# Patient Record
Sex: Male | Born: 1990 | Hispanic: Yes | Marital: Single | Smoking: Current some day smoker
Health system: Southern US, Community
[De-identification: ages and names within clinical notes are randomized; demographics above are authoritative.]

---
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
## 2013-02-07 ENCOUNTER — Emergency Department: Payer: Self-pay | Admitting: Emergency Medicine

## 2013-02-09 LAB — BETA STREP CULTURE(ARMC)

## 2015-10-30 ENCOUNTER — Emergency Department
Admission: EM | Admit: 2015-10-30 | Discharge: 2015-10-30 | Disposition: A | Discharge status: Home / Self Care | Payer: Commercial Managed Care - PPO | Attending: Emergency Medicine | Admitting: Emergency Medicine

## 2015-10-30 ENCOUNTER — Encounter: Payer: Self-pay | Admitting: Emergency Medicine

## 2015-10-30 DIAGNOSIS — S81812A Laceration without foreign body, left lower leg, initial encounter: Secondary | ICD-10-CM | POA: Diagnosis present

## 2015-10-30 DIAGNOSIS — F17200 Nicotine dependence, unspecified, uncomplicated: Secondary | ICD-10-CM | POA: Insufficient documentation

## 2015-10-30 DIAGNOSIS — Y9269 Other specified industrial and construction area as the place of occurrence of the external cause: Secondary | ICD-10-CM | POA: Diagnosis not present

## 2015-10-30 DIAGNOSIS — Y288XXA Contact with other sharp object, undetermined intent, initial encounter: Secondary | ICD-10-CM | POA: Diagnosis not present

## 2015-10-30 DIAGNOSIS — Y9389 Activity, other specified: Secondary | ICD-10-CM | POA: Insufficient documentation

## 2015-10-30 DIAGNOSIS — Y990 Civilian activity done for income or pay: Secondary | ICD-10-CM | POA: Insufficient documentation

## 2015-10-30 DIAGNOSIS — Z23 Encounter for immunization: Secondary | ICD-10-CM | POA: Diagnosis not present

## 2015-10-30 MED ORDER — LIDOCAINE-EPINEPHRINE (PF) 1 %-1:200000 IJ SOLN
10.0000 mL | Freq: Once | INTRAMUSCULAR | Status: DC
  Filled 2015-10-30: qty 30

## 2015-10-30 MED ORDER — TETANUS-DIPHTH-ACELL PERTUSSIS 5-2.5-18.5 LF-MCG/0.5 IM SUSP
0.5000 mL | Freq: Once | INTRAMUSCULAR | Status: AC
  Administered 2015-10-30: 0.5 mL via INTRAMUSCULAR
  Filled 2015-10-30: qty 0.5

## 2015-10-30 MED ORDER — BACITRACIN ZINC 500 UNIT/GM EX OINT
TOPICAL_OINTMENT | CUTANEOUS | Status: AC
  Filled 2015-10-30: qty 0.9

## 2015-10-30 NOTE — ED Notes (Signed)
Laceration to left shin from metal rebar. Bleeding controlled. Tetanus not utd. Pain 7/10

## 2015-10-30 NOTE — ED Triage Notes (Signed)
Approx 1 hour ago was walking and cut leg on piece of rebar sticking out of ground. Material and tape dressing done by patient left on, no bleed through.

## 2015-10-30 NOTE — ED Provider Notes (Signed)
[facility] Emergency Department Provider Note  ____________________________________________  Time seen: Approximately 6:32 PM  I have reviewed the triage vital signs and the nursing notes.   HISTORY  Chief Complaint Extremity Laceration    HPI Peter Rose PhiRodriguez Jr. is a 25 y.o. male resistance emergency department for laceration to the left shin. Patient states that he was at work when he ran into exposed [employer], [employer] rebar. Patient states that he sustained a laceration to the shin. He was able control bleeding with direct pressure. He is unsure of his last tetanus immunization. Pain is minimal at this time. No other complaints.   History reviewed. No pertinent past medical history.  There are no active problems to display for this patient.   History reviewed. No pertinent surgical history.  Prior to Admission medications   Not on File    Allergies Review of patient's allergies indicates no known allergies.  No family history on file.  Social History Social History  Substance Use Topics  . Smoking status: Current Some Day Smoker  . Smokeless tobacco: Not on file  . Alcohol use Not on file     Review of Systems  Constitutional: No fever/chills Cardiovascular: no chest pain. Respiratory: no cough. No SOB. Musculoskeletal: Negative for musculoskeletal pain. Skin: Positive for laceration to the left shin Neurological: Negative for headaches, focal weakness or numbness. 10-point ROS otherwise negative.  ____________________________________________   PHYSICAL EXAM:  VITAL SIGNS: ED Triage Vitals  Enc Vitals Group     BP 10/30/15 1622 119/88     Pulse Rate 10/30/15 1622 75     Resp 10/30/15 1622 20     Temp 10/30/15 1622 98.1 F (36.7 C)     Temp Source 10/30/15 1622 Oral     SpO2 10/30/15 1622 97 %     Weight 10/30/15 1623 165 lb (74.8 kg)     Height 10/30/15 1623 6\' 1"  (1.854 m)     Head Circumference --      Peak Flow --      Pain  Score 10/30/15 1623 6     Pain Loc --      Pain Edu? --      Excl. in GC? --      Constitutional: Alert and oriented. Well appearing and in no acute distress. Eyes: Conjunctivae are normal. PERRL. EOMI. Head: Atraumatic. Cardiovascular: Normal rate, regular rhythm. Normal S1 and S2.  Good peripheral circulation. Respiratory: Normal respiratory effort without tachypnea or retractions. Lungs CTAB. Good air entry to the bases with no decreased or absent breath sounds. Musculoskeletal: Full range of motion to all extremities. No gross deformities appreciated. Neurologic:  Normal speech and language. No gross focal neurologic deficits are appreciated.  Skin:  Skin is warm, dry and intact. No rash noted. 10 cm laceration is noted to the left shin. This is gaped open approximately 1 to 1-1/2 cm. No bleeding at this time. Entire laceration is visualized no foreign body. Examination of the joint above and below the laceration are unremarkable. Psychiatric: Mood and affect are normal. Speech and behavior are normal. Patient exhibits appropriate insight and judgement.   ____________________________________________   LABS (all labs ordered are listed, but only abnormal results are displayed)  Labs Reviewed - No data to display ____________________________________________  EKG   ____________________________________________  RADIOLOGY   No results found.  ____________________________________________    PROCEDURES  Procedure(s) performed:    Marland Kitchen.Marland Kitchen.Laceration Repair Date/Time: 10/30/2015 8:39 PM Performed by: Gala RomneyUTHRIELL, Glory Graefe D Authorized by: Gala RomneyUTHRIELL, Geneive Sandstrom  D   Consent:    Consent obtained:  Verbal   Consent given by:  Patient   Risks discussed:  Pain Anesthesia (see MAR for exact dosages):    Anesthesia method:  Local infiltration   Local anesthetic:  Lidocaine 1% WITH epi Laceration details:    Location:  Leg   Leg location:  L lower leg   Length (cm):  11 Repair  type:    Repair type:  Simple Pre-procedure details:    Preparation:  Patient was prepped and draped in usual sterile fashion Exploration:    Hemostasis achieved with:  Direct pressure   Wound exploration: wound explored through full range of motion and entire depth of wound probed and visualized     Contaminated: no   Treatment:    Area cleansed with:  Betadine   Amount of cleaning:  Standard   Irrigation solution:  Sterile saline   Irrigation method:  Syringe Skin repair:    Repair method:  Sutures   Suture size:  3-0   Suture material:  Nylon   Suture technique:  Horizontal mattress   Number of sutures:  8 Approximation:    Approximation:  Close Post-procedure details:    Dressing:  Non-adherent dressing   Patient tolerance of procedure:  Tolerated well, no immediate complications      Medications  lidocaine-EPINEPHrine (XYLOCAINE-EPINEPHrine) 1 %-1:200000 (PF) injection 10 mL (not administered)  bacitracin 500 UNIT/GM ointment (not administered)  Tdap (BOOSTRIX) injection 0.5 mL (0.5 mLs Intramuscular Given 10/30/15 1922)     ____________________________________________   INITIAL IMPRESSION / ASSESSMENT AND PLAN / ED COURSE  Pertinent labs & imaging results that were available during my care of the patient were reviewed by me and considered in my medical decision making (see chart for details).  Review of the [place] CSRS was performed in accordance of the [facility] prior to dispensing any controlled drugs.  Clinical Course    Patient's diagnosis is consistent with Laceration to the left lower leg. This is closed as described above. No complications. Patient is given wound care shortened. He will follow-up with primary care or urgent care in 7-10 days for suture removal.. Patient is given ED precautions to return to the ED for any worsening or new symptoms.     ____________________________________________  FINAL CLINICAL IMPRESSION(S) / ED DIAGNOSES  Final  diagnoses:  Leg laceration, left, initial encounter      NEW MEDICATIONS STARTED DURING THIS VISIT:  There are no discharge medications for this patient.       This chart was dictated using voice recognition software/Dragon. Despite best efforts to proofread, errors can occur which can change the meaning. Any change was purely unintentional.    Racheal Patches, PA-C 10/30/15 2040    Phineas Semen, MD 10/31/15 [phone]

## 2016-01-10 ENCOUNTER — Emergency Department
Admission: EM | Admit: 2016-01-10 | Discharge: 2016-01-10 | Disposition: A | Discharge status: Home / Self Care | Payer: Commercial Managed Care - PPO

## 2016-01-10 NOTE — ED Notes (Signed)
No answer when called for triage 

## 2019-06-27 ENCOUNTER — Encounter: Payer: Self-pay | Admitting: Physician Assistant

## 2019-06-27 ENCOUNTER — Ambulatory Visit: Payer: Self-pay | Admitting: Physician Assistant

## 2019-06-27 ENCOUNTER — Other Ambulatory Visit: Payer: Self-pay

## 2019-06-27 DIAGNOSIS — A5401 Gonococcal cystitis and urethritis, unspecified: Secondary | ICD-10-CM

## 2019-06-27 DIAGNOSIS — Z113 Encounter for screening for infections with a predominantly sexual mode of transmission: Secondary | ICD-10-CM

## 2019-06-27 LAB — GRAM STAIN

## 2019-06-27 MED ORDER — CEFTRIAXONE SODIUM 250 MG IJ SOLR
500.0000 mg | Freq: Once | INTRAMUSCULAR | Status: AC
  Administered 2019-06-27: 500 mg via INTRAMUSCULAR

## 2019-06-27 MED ORDER — DOXYCYCLINE HYCLATE 100 MG PO CAPS
100.0000 mg | ORAL_CAPSULE | Freq: Two times a day (BID) | ORAL | 0 refills | Status: AC
Start: 2019-06-27 — End: 2019-07-04

## 2019-06-27 NOTE — Progress Notes (Signed)
Here today for STD screening. Accepts bloodwork. Keilon Ressel, RN ° °

## 2019-06-27 NOTE — Progress Notes (Signed)
Gram Stain results reviewed by provider C. [place], [place]. Per standing and verbal orders patient treated for Gonorrhea. Tawny Hopping, RN

## 2019-06-28 NOTE — Progress Notes (Signed)
   [facility] Department STI clinic/screening visit  Subjective:  Peter Barrett. is a 29 y.o. male being seen today for an STI screening visit. The patient reports they do have symptoms.    Patient has the following medical conditions:  There are no problems to display for this patient.    Chief Complaint  Patient presents with  . SEXUALLY TRANSMITTED DISEASE    HPI  Patient reports that he has been having clear/creamy/yellow discharge for 2 weeks and slight dysuria.  States that he and a partner were treated last year for Chlamydia and thinks that he may have this again.  Denies other symptoms, chronic conditions and regular medications.   See flowsheet for further details and programmatic requirements.    The following portions of the patient's history were reviewed and updated as appropriate: allergies, current medications, past medical history, past social history, past surgical history and problem list.  Objective:  There were no vitals filed for this visit.  Physical Exam Constitutional:      General: He is not in acute distress.    Appearance: Normal appearance.  HENT:     Head: Normocephalic and atraumatic.     Comments: No nits, lice, or hair loss. No cervical, supraclavicular or axillary adenopathy.    Mouth/Throat:     Mouth: Mucous membranes are moist.     Pharynx: Oropharynx is clear. No oropharyngeal exudate or posterior oropharyngeal erythema.  Eyes:     Conjunctiva/sclera: Conjunctivae normal.  Pulmonary:     Effort: Pulmonary effort is normal.  Abdominal:     Palpations: Abdomen is soft. There is no mass.     Tenderness: There is no abdominal tenderness. There is no guarding or rebound.  Genitourinary:    Penis: Normal.      Testes: Normal.     Comments: Pubic area without nits, lice, edema, erythema, lesions and inguinal adenopathy. Penis circumcised, without rash or lesions. Small amount of whitish discharge at  meatus. Musculoskeletal:     Cervical back: Neck supple. No tenderness.  Skin:    General: Skin is warm and dry.     Findings: No bruising, erythema, lesion or rash.  Neurological:     Mental Status: He is alert and oriented to person, place, and time.  Psychiatric:        Mood and Affect: Mood normal.        Behavior: Behavior normal.        Thought Content: Thought content normal.        Judgment: Judgment normal.       Assessment and Plan:  Peter Barrett. is a 29 y.o. male presenting to [facility] for STI screening  1. Screening for STD (sexually transmitted disease) Patient into clinic with symptoms. Changed mind during visit and declines blood work today. Rec condoms with all sex. - Gram stain - Gonococcus culture - Gonococcus culture  2. Gonococcal urethritis in male Treat for GC and cover for Chlamydia with Ceftriaxone 500mg  IM and Doxycycline 100mg  #14 1 po BID for 7 days. No sex until 7 days after completing medicine and until after partner/s complete treatment. Call with questions or concerns. - cefTRIAXone (ROCEPHIN) injection 500 mg - doxycycline (VIBRAMYCIN) 100 MG capsule; Take 1 capsule (100 mg total) by mouth 2 (two) times daily for 7 days.  Dispense: 14 capsule; Refill: 0     No follow-ups on file.  No future appointments.  , PA

## 2023-11-26 ENCOUNTER — Ambulatory Visit (HOSPITAL_COMMUNITY)
Admission: EM | Admit: 2023-11-26 | Discharge: 2023-11-26 | Disposition: A | Discharge status: Home / Self Care | Payer: Self-pay | Attending: Emergency Medicine | Admitting: Emergency Medicine

## 2023-11-26 ENCOUNTER — Encounter (HOSPITAL_COMMUNITY): Payer: Self-pay

## 2023-11-26 DIAGNOSIS — R1030 Lower abdominal pain, unspecified: Secondary | ICD-10-CM | POA: Insufficient documentation

## 2023-11-26 DIAGNOSIS — R300 Dysuria: Secondary | ICD-10-CM | POA: Insufficient documentation

## 2023-11-26 DIAGNOSIS — Z113 Encounter for screening for infections with a predominantly sexual mode of transmission: Secondary | ICD-10-CM | POA: Insufficient documentation

## 2023-11-26 LAB — HIV ANTIBODY (ROUTINE TESTING W REFLEX): HIV Screen 4th Generation wRfx: NONREACTIVE

## 2023-11-26 LAB — POCT URINALYSIS DIP (MANUAL ENTRY)
Bilirubin, UA: NEGATIVE
Glucose, UA: NEGATIVE mg/dL
Ketones, POC UA: NEGATIVE mg/dL
Leukocytes, UA: NEGATIVE
Nitrite, UA: NEGATIVE
Protein Ur, POC: NEGATIVE mg/dL
Spec Grav, UA: 1.02 (ref 1.010–1.025)
Urobilinogen, UA: 0.2 U/dL
pH, UA: 7 (ref 5.0–8.0)

## 2023-11-26 NOTE — Discharge Instructions (Addendum)
 Your swab, blood work, and urine culture results will all return over the next few days and someone if results are positive or require any additional treatment. You can follow-up with [facility] or the mobile health clinic (schedules attached to the back of your paperwork) for evaluation of ongoing symptoms if needed. You can also scan the QR code on the very last page for paperwork to schedule appointment with a primary care provider for further evaluation as well. Return here as needed. If you develop severe abdominal pain, excessive vomiting, excessive diarrhea, blood in your vomit or stool, high fevers, or weakness please seek immediate medical treatment in the emergency department.

## 2023-11-26 NOTE — ED Provider Notes (Signed)
 [facility]    CSN: [id number] Arrival date & time: 11/26/23  1456      History   Chief Complaint Chief Complaint  Patient presents with   Abdominal Pain    HPI Peter Barrett. is a 33 y.o. male.   Patient presents with intermittent lower abdominal pain for about 2 to 3 weeks.  Patient states that this pain is not severe and he is currently not experiencing it.  Patient states that it has been on and off periodically over the last few weeks and when this occurs he also has some burning with urination as well.    Patient states that he did have a normal bowel movement today and has normal bowel movements every day that do not seem to be different from his normal.  Patient states that he does not have constant dysuria and this is intermittent as well.    Patient denies urinary frequency/urgency, hematuria, penile discharge, penile/testicular pain or swelling, penile lesions, flank pain, nausea, vomiting, diarrhea, and blood in stool.  Patient reports that he is sexually active and he is requesting STD testing to be sure that this is not related.  Denies any known exposures to STDs.  Patient reports that he does not have a primary care provider and is requesting resources for this.  The history is provided by the patient and medical records.  Abdominal Pain   History reviewed. No pertinent past medical history.  There are no active problems to display for this patient.   History reviewed. No pertinent surgical history.     Home Medications    Prior to Admission medications   Not on File    Family History History reviewed. No pertinent family history.  Social History Social History   Tobacco Use   Smoking status: Some Days   Smokeless tobacco: Never  Vaping Use   Vaping status: Some Days  Substance Use Topics   Alcohol use: Yes     Allergies   Patient has no known allergies.   Review of Systems Review of Systems  Gastrointestinal:   Positive for abdominal pain.   Per HPI  Physical Exam Triage Vital Signs ED Triage Vitals  Encounter Vitals Group     BP 11/26/23 1630 113/63     Girls Systolic BP Percentile --      Girls Diastolic BP Percentile --      Boys Systolic BP Percentile --      Boys Diastolic BP Percentile --      Pulse Rate 11/26/23 1630 76     Resp 11/26/23 1630 18     Temp 11/26/23 1630 98.2 F (36.8 C)     Temp Source 11/26/23 1630 Oral     SpO2 11/26/23 1630 96 %     Weight --      Height --      Head Circumference --      Peak Flow --      Pain Score 11/26/23 1626 0     Pain Loc --      Pain Education --      Exclude from Growth Chart --    No data found.  Updated Vital Signs BP 113/63 (BP Location: Left Arm)   Pulse 76   Temp 98.2 F (36.8 C) (Oral)   Resp 18   SpO2 96%   Visual Acuity Right Eye Distance:   Left Eye Distance:   Bilateral Distance:    Right Eye Near:   Left Eye  Near:    Bilateral Near:     Physical Exam Vitals and nursing note reviewed.  Constitutional:      General: He is awake. He is not in acute distress.    Appearance: Normal appearance. He is well-developed and well-groomed. He is not ill-appearing.  Cardiovascular:     Rate and Rhythm: Normal rate and regular rhythm.  Pulmonary:     Effort: Pulmonary effort is normal.     Breath sounds: Normal breath sounds.  Abdominal:     General: Abdomen is flat. Bowel sounds are normal. There is no distension.     Palpations: Abdomen is soft. There is no mass.     Tenderness: There is no abdominal tenderness. There is no right CVA tenderness, left CVA tenderness, guarding or rebound.     Hernia: No hernia is present.  Genitourinary:    Comments: Exam deferred Skin:    General: Skin is warm and dry.  Neurological:     General: No focal deficit present.     Mental Status: He is alert and oriented to person, place, and time. Mental status is at baseline.  Psychiatric:        Behavior: Behavior is  cooperative.      UC Treatments / Results  Labs (all labs ordered are listed, but only abnormal results are displayed) Labs Reviewed  POCT URINALYSIS DIP (MANUAL ENTRY) - Abnormal; Notable for the following components:      Result Value   Blood, UA small (*)    All other components within normal limits  URINE CULTURE  HIV ANTIBODY (ROUTINE TESTING W REFLEX)  RPR  CYTOLOGY, (ORAL, ANAL, URETHRAL) ANCILLARY ONLY    EKG   Radiology No results found.  Procedures Procedures (including critical care time)  Medications Ordered in UC Medications - No data to display  Initial Impression / Assessment and Plan / UC Course  I have reviewed the triage vital signs and the nursing notes.  Pertinent labs & imaging results that were available during my care of the patient were reviewed by me and considered in my medical decision making (see chart for details).     Patient is overall well-appearing.  Vitals are stable.  Abdomen is flat, soft, and nontender.  Bowel sounds are normal.  GU exam deferred.  Patient forms all swab for STD.  HIV and RPR ordered.  Urinalysis reveals small RBCs, but there were no other significant findings related to this.  Will send urine culture to confirm there is no presence of urinary tract infection due to presence of dysuria and lower abdominal pain.  Discussed follow-up, return, and strict ER precautions. Final Clinical Impressions(s) / UC Diagnoses   Final diagnoses:  Dysuria  Intermittent lower abdominal pain  Screen for STD (sexually transmitted disease)     Discharge Instructions      Your swab, blood work, and urine culture results will all return over the next few days and someone if results are positive or require any additional treatment. You can follow-up with [facility] or the mobile health clinic (schedules attached to the back of your paperwork) for evaluation of ongoing symptoms if needed. You can  also scan the QR code on the very last page for paperwork to schedule appointment with a primary care provider for further evaluation as well. Return here as needed. If you develop severe abdominal pain, excessive vomiting, excessive diarrhea, blood in your vomit or stool, high fevers, or weakness please seek  immediate medical treatment in the emergency department.   ED Prescriptions   None    PDMP not reviewed this encounter.   Johnie Flaming A, NP 11/26/23 [phone]

## 2023-11-26 NOTE — ED Triage Notes (Signed)
 Pt c/o lower abdominal pain x2-3 wks. Last normal BM was today. States feels like he has a hernia too. Requesting STD testing with blood work. States sometimes has burning on urination.

## 2023-11-27 LAB — RPR: RPR Ser Ql: NONREACTIVE

## 2023-11-28 ENCOUNTER — Ambulatory Visit (HOSPITAL_COMMUNITY): Payer: Self-pay

## 2023-11-28 LAB — CYTOLOGY, (ORAL, ANAL, URETHRAL) ANCILLARY ONLY
Chlamydia: NEGATIVE
Comment: NEGATIVE
Comment: NEGATIVE
Comment: NORMAL
Neisseria Gonorrhea: NEGATIVE
Trichomonas: NEGATIVE

## 2023-11-28 LAB — URINE CULTURE: Culture: NO GROWTH
